# Patient Record
Sex: Male | Born: 1965 | Race: White | Hispanic: No | Marital: Married | Smoking: Never smoker
Health system: Southern US, Community
[De-identification: ages and names within clinical notes are randomized; demographics above are authoritative.]

## PROBLEM LIST (undated history)

## (undated) DIAGNOSIS — E7800 Pure hypercholesterolemia, unspecified: Secondary | ICD-10-CM

## (undated) DIAGNOSIS — R42 Dizziness and giddiness: Secondary | ICD-10-CM

## (undated) DIAGNOSIS — R079 Chest pain, unspecified: Secondary | ICD-10-CM

## (undated) DIAGNOSIS — Z6830 Body mass index (BMI) 30.0-30.9, adult: Secondary | ICD-10-CM

## (undated) HISTORY — DX: Chest pain, unspecified: R07.9

## (undated) HISTORY — DX: Pure hypercholesterolemia, unspecified: E78.00

## (undated) HISTORY — DX: Body mass index (BMI) 30.0-30.9, adult: Z68.30

## (undated) HISTORY — DX: Dizziness and giddiness: R42

---

## 1998-11-19 ENCOUNTER — Emergency Department (HOSPITAL_COMMUNITY): Admission: EM | Admit: 1998-11-19 | Discharge: 1998-11-19 | Payer: Self-pay | Admitting: Emergency Medicine

## 1999-05-08 ENCOUNTER — Emergency Department (HOSPITAL_COMMUNITY): Admission: EM | Admit: 1999-05-08 | Discharge: 1999-05-08 | Payer: Self-pay | Admitting: Emergency Medicine

## 2000-05-10 ENCOUNTER — Emergency Department (HOSPITAL_COMMUNITY): Admission: EM | Admit: 2000-05-10 | Discharge: 2000-05-10 | Payer: Self-pay | Admitting: Internal Medicine

## 2004-10-06 ENCOUNTER — Emergency Department (HOSPITAL_COMMUNITY): Admission: EM | Admit: 2004-10-06 | Discharge: 2004-10-06 | Payer: Self-pay | Admitting: Emergency Medicine

## 2016-08-23 ENCOUNTER — Ambulatory Visit (INDEPENDENT_AMBULATORY_CARE_PROVIDER_SITE_OTHER): Payer: Self-pay | Admitting: Podiatry

## 2016-08-23 ENCOUNTER — Telehealth: Payer: Self-pay | Admitting: Podiatry

## 2016-08-23 ENCOUNTER — Encounter: Payer: Self-pay | Admitting: Podiatry

## 2016-08-23 ENCOUNTER — Ambulatory Visit (INDEPENDENT_AMBULATORY_CARE_PROVIDER_SITE_OTHER): Payer: Self-pay

## 2016-08-23 VITALS — BP 157/95 | HR 83 | Resp 16 | Ht 69.0 in | Wt 188.0 lb

## 2016-08-23 DIAGNOSIS — M722 Plantar fascial fibromatosis: Secondary | ICD-10-CM

## 2016-08-23 DIAGNOSIS — M779 Enthesopathy, unspecified: Secondary | ICD-10-CM

## 2016-08-23 MED ORDER — DICLOFENAC SODIUM 75 MG PO TBEC: 75.0000 mg | DELAYED_RELEASE_TABLET | Freq: Two times a day (BID) | ORAL | 2 refills | Status: DC

## 2016-08-23 MED ORDER — TRIAMCINOLONE ACETONIDE 10 MG/ML IJ SUSP
10.0000 mg | Freq: Once | INTRAMUSCULAR | Status: AC
  Administered 2016-08-23: 10 mg

## 2016-08-23 NOTE — Patient Instructions (Signed)

## 2016-08-23 NOTE — Telephone Encounter (Signed)
I asked pt the kind of shoe he had to wear at work and he said he could wear any style. I instructed pt to try New Balance running, [employer]iacs or Sau[employer]y and get wide so toes can spread when stepping off. Pt asked if the Dr. Jari SportsmanScholls gel inserts were worthwhile and I said they compress down and don't give support and eventually don't give cushion. I told him a custom made orthotic would work best.

## 2016-08-23 NOTE — Telephone Encounter (Signed)
Pt forgot to ask you at his appointment today what type of work shoe should he wear. 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

## 2016-08-23 NOTE — Progress Notes (Signed)
   Subjective:    Patient ID: Jonathon Marshall, male    DOB: 19-Apr-1966, 51 y.o.   MRN: [id number]  HPI Chief Complaint  Patient presents with  . Foot Pain    Bilateral; back of heel; pt stated, "Has had pain since Sunday, August 18, 2016"      Review of Systems  Musculoskeletal: Positive for gait problem.  All other systems reviewed and are negative.      Objective:   Physical Exam        Assessment & Plan:

## 2016-08-24 NOTE — Progress Notes (Signed)
Subjective:     Patient ID: Jonathon Marshall, Jonathon Marshall   DOB: 22-Nov-1965, 51 y.o.   MRN: [id number]  HPI patient states she has developed pain in the back of both heels and that it is acute and he is not able to work due to discomfort. He did do some different activities and seen to flared up   Review of Systems  All other systems reviewed and are negative.      Objective:   Physical Exam  Constitutional: He is oriented to person, place, and time.  Cardiovascular: Intact distal pulses.   Musculoskeletal: Normal range of motion.  Neurological: He is oriented to person, place, and time.  Skin: Skin is warm.  Nursing note and vitals reviewed.  neurovascular status intact muscle strength adequate range of motion within normal limits with no equinus noted. Patient's found to have inflammatory posterior Achilles tendinitis lateral side bilateral with quite a bit of pain when palpated and the central and medial side seems to be pain free currently. It is very inflamed and he's tried ice and shoe gear modifications     Assessment:     Acute Achilles tendinitis bilateral    Plan:     H&P condition reviewed. I did discuss careful injection treatment of the lateral side explaining chances for rupture associated with this and patient wants procedure bilateral. Carefully injected the lateral side keeping it away from the central and medial with 3 mg Dexon some Kenalog 5 mill grams Xylocaine and advised on reduced activity and ice therapy. Placed on diclofenac and reappoint in several weeks to reevaluate  X-rays indicate posterior spur formation bilateral

## 2016-09-11 ENCOUNTER — Ambulatory Visit: Payer: Self-pay | Admitting: Podiatry

## 2016-10-23 ENCOUNTER — Telehealth: Payer: Self-pay | Admitting: *Deleted

## 2016-10-23 NOTE — Telephone Encounter (Signed)
Pt states he was seen 08/23/2016 concerning achilles tendonitis. I spoke with pt and he stated he received the injections 08/23/2016 and they helped for about 1 1/2 months but in the last 3-4 weeks he has begun to have the pain again although not as bad, what should he do. I told pt to make an appt, and to rest, ice 3-4 times daily for 15-20 minutes each session protecting skin with cloth from the ice pack and take his antiinflammatory. Pt states he did not get his antiinflammatory and I told him to get that it will help with the pain and inflammation. I told pt he could also perform non-weight bearing gentle stretches while seated, push heels out and point toes to the nose with knees straight and hold 30-60 seconds, discontinue if painful. Pt states he will get insurance sometime in May and will call for an appt, sooner if symptoms worsen.

## 2016-11-05 ENCOUNTER — Telehealth: Payer: Self-pay | Admitting: *Deleted

## 2016-11-05 NOTE — Telephone Encounter (Addendum)
Pt called stating he has an appt 11/20/2016, but has questions. Left message that I would call again.12/26/2016-Pt states he has questions before coming in. I spoke with pt, he states he is have excruciating pain in the back of his heels and he doesn't want to have surgery and he doesn't want to be on medications. I told pt he would benefit from discussing his concerns with Dr. Charlsie Merles. Pt states he has an appt tomorrow.

## 2016-11-20 ENCOUNTER — Ambulatory Visit: Payer: 59 | Admitting: Podiatry

## 2016-11-29 ENCOUNTER — Ambulatory Visit: Payer: 59 | Admitting: Podiatry

## 2016-12-27 ENCOUNTER — Ambulatory Visit: Payer: 59 | Admitting: Podiatry

## 2017-02-24 ENCOUNTER — Ambulatory Visit (INDEPENDENT_AMBULATORY_CARE_PROVIDER_SITE_OTHER): Payer: 59 | Admitting: Podiatry

## 2017-02-24 ENCOUNTER — Encounter: Payer: Self-pay | Admitting: Podiatry

## 2017-02-24 VITALS — BP 140/92 | HR 89 | Ht 69.0 in | Wt 180.0 lb

## 2017-02-24 DIAGNOSIS — M7661 Achilles tendinitis, right leg: Secondary | ICD-10-CM

## 2017-02-24 DIAGNOSIS — M7662 Achilles tendinitis, left leg: Secondary | ICD-10-CM

## 2017-02-24 MED ORDER — NABUMETONE 500 MG PO TABS: 500.0000 mg | ORAL_TABLET | Freq: Two times a day (BID) | ORAL | 1 refills | Status: DC

## 2017-02-24 NOTE — Progress Notes (Signed)
SUBJECTIVE: 51 y.o. year old male presents complaining of pain on both posterior heel duration of many years. Recently got flared up. Also he had to run to stop a machine at work. At that time the tendon got pulled and started to hurt bad.  Patient was referred by Dr. Zachary Georgeomaszewski.  HPI: Been treated with NSAIA, Cortisone injection and orthotics for heel pain.  REVIEW OF SYSTEMS: Pertinent items noted in HPI and remainder of comprehensive ROS otherwise negative.  OBJECTIVE: DERMATOLOGIC EXAMINATION: Normal findings.  VASCULAR EXAMINATION OF LOWER LIMBS: All pedal pulses are palpable with normal pulsation.  Capillary Filling times within 3 seconds in all digits.  Noted of no visible edema or erythema noted. Temperature gradient from tibial crest to dorsum of foot is within normal bilateral.  NEUROLOGIC EXAMINATION OF THE LOWER LIMBS: All epicritic and tactile sensations grossly intact.  MUSCULOSKELETAL EXAMINATION: Positive for pain at the posterior heel bilateral.  ASSESSMENT: Achilles tendonitis bilateral.  PLAN: Reviewed clinical findings and available treatment options. Night Splint dispensed with instruction. Relafen prescribed. Patient will return when ready to schedule surgery.

## 2017-02-24 NOTE — Patient Instructions (Addendum)
Seen for pain in both posterior heel bilateral. Reviewed findings and available options.  Night splint dispensed with instruction. Relafen prescribed. Return if needed surgery scheduled.

## 2017-02-26 ENCOUNTER — Ambulatory Visit: Payer: 59 | Admitting: Podiatry

## 2017-03-04 ENCOUNTER — Ambulatory Visit: Payer: 59 | Admitting: Podiatry

## 2018-05-26 ENCOUNTER — Ambulatory Visit: Payer: 59 | Admitting: Podiatry

## 2021-07-13 ENCOUNTER — Emergency Department (HOSPITAL_COMMUNITY): Payer: 59

## 2021-07-13 ENCOUNTER — Other Ambulatory Visit: Payer: Self-pay

## 2021-07-13 ENCOUNTER — Encounter (HOSPITAL_COMMUNITY): Payer: Self-pay

## 2021-07-13 ENCOUNTER — Emergency Department (HOSPITAL_COMMUNITY)
Admission: EM | Admit: 2021-07-13 | Discharge: 2021-07-14 | Disposition: A | Discharge status: Home / Self Care | Payer: 59 | Attending: Emergency Medicine | Admitting: Emergency Medicine

## 2021-07-13 DIAGNOSIS — R55 Syncope and collapse: Secondary | ICD-10-CM | POA: Insufficient documentation

## 2021-07-13 DIAGNOSIS — R072 Precordial pain: Secondary | ICD-10-CM | POA: Insufficient documentation

## 2021-07-13 DIAGNOSIS — R110 Nausea: Secondary | ICD-10-CM | POA: Insufficient documentation

## 2021-07-13 DIAGNOSIS — I10 Essential (primary) hypertension: Secondary | ICD-10-CM | POA: Diagnosis not present

## 2021-07-13 DIAGNOSIS — R42 Dizziness and giddiness: Secondary | ICD-10-CM | POA: Diagnosis present

## 2021-07-13 LAB — CBC
HCT: 45.4 % (ref 39.0–52.0)
Hemoglobin: 15.9 g/dL (ref 13.0–17.0)
MCH: 31.2 pg (ref 26.0–34.0)
MCHC: 35 g/dL (ref 30.0–36.0)
MCV: 89 fL (ref 80.0–100.0)
Platelets: 275 10*3/uL (ref 150–400)
RBC: 5.1 MIL/uL (ref 4.22–5.81)
RDW: 12.4 % (ref 11.5–15.5)
WBC: 7.8 10*3/uL (ref 4.0–10.5)
nRBC: 0 % (ref 0.0–0.2)

## 2021-07-13 LAB — BASIC METABOLIC PANEL
Anion gap: 7 (ref 5–15)
BUN: 12 mg/dL (ref 6–20)
CO2: 24 mmol/L (ref 22–32)
Calcium: 9.2 mg/dL (ref 8.9–10.3)
Chloride: 103 mmol/L (ref 98–111)
Creatinine, Ser: 1.23 mg/dL (ref 0.61–1.24)
GFR, Estimated: >60 mL/min (ref >60)
Glucose, Bld: 104 mg/dL — ABNORMAL HIGH (ref 70–99)
Potassium: 4.2 mmol/L (ref 3.5–5.1)
Sodium: 134 mmol/L — ABNORMAL LOW (ref 135–145)

## 2021-07-13 LAB — TROPONIN I (HIGH SENSITIVITY)
Troponin I (High Sensitivity): 8 ng/L (ref <18)
Troponin I (High Sensitivity): 7 ng/L (ref <18)

## 2021-07-13 NOTE — ED Provider Notes (Signed)
[facility] EMERGENCY DEPARTMENT Provider Note   CSN: [id number] Arrival date & time: 07/13/21  1416     History Chief Complaint  Patient presents with   Chest Pain   Dizziness    Jonathon Marshall is a 55 y.o. male with no significant past medical history who presents after an episode of chest pain.  The patient was had a work Engineer, drilling for [employer] when all of a sudden he had a crushing substernal chest pain.  He is unable to characterize the pain further other than saying "it just hurt really bad."  The pain did not radiate.  He felt lightheaded, nauseous, and then felt his head drop.  When he came to, everyone in the room was staring at him and he realized he had almost passed out.  His symptoms resolved spontaneously after about 5 to 10 minutes.  He describes a similar episode of chest pain about 2 weeks ago that also occurred at rest but was not associated with nausea, lightheadedness, or syncope.  He denies recent illnesses, fevers, chills, cough, congestion, shortness of breath, abdominal pain, urinary symptoms, or changes to bowel movements.  He is currently asymptomatic. HPI: A 55 year old patient with a history of hypertension presents for evaluation of chest pain. Initial onset of pain was more than 6 hours ago. The patient's chest pain is not worse with exertion. The patient's chest pain is middle- or left-sided, is not well-localized, is not described as heaviness/pressure/tightness, is not sharp and does not radiate to the arms/jaw/neck. The patient does not complain of nausea and denies diaphoresis. The patient has no history of stroke, has no history of peripheral artery disease, has not smoked in the past 90 days, denies any history of treated diabetes, has no relevant family history of coronary artery disease (first degree relative at less than age 17), has no history of hypercholesterolemia and does not have an elevated BMI (>=30).   History reviewed. No  pertinent past medical history.  There are no problems to display for this patient.   History reviewed. No pertinent surgical history.     History reviewed. No pertinent family history.  Social History   Tobacco Use   Smoking status: Never   Smokeless tobacco: Never    Home Medications Prior to Admission medications   Medication Sig Start Date End Date Taking? Authorizing Provider  diclofenac (VOLTAREN) 75 MG EC tablet Take 1 tablet (75 mg total) by mouth 2 (two) times daily. Patient not taking: Reported on 02/24/2017 08/23/16   Wallene Huh, DPM  nabumetone (RELAFEN) 500 MG tablet Take 1 tablet (500 mg total) by mouth 2 (two) times daily. Patient not taking: Reported on 07/13/2021 02/24/17   Camelia Phenes, DPM    Allergies    Radish [raphanus sativus] and Sulfa antibiotics  Review of Systems   Review of Systems  Constitutional:  Negative for chills and fever.  HENT:  Negative for ear pain and sore throat.   Eyes:  Negative for pain and visual disturbance.  Respiratory:  Negative for cough and shortness of breath.   Cardiovascular:  Positive for chest pain. Negative for palpitations.  Gastrointestinal:  Positive for nausea. Negative for abdominal pain and vomiting.  Genitourinary:  Negative for dysuria and hematuria.  Musculoskeletal:  Negative for arthralgias and back pain.  Skin:  Negative for color change and rash.  Neurological:  Positive for dizziness, syncope and light-headedness. Negative for seizures.  All other systems reviewed and are negative.  Physical  Exam Updated Vital Signs BP 132/90    Pulse 72    Temp 98.4 F (36.9 C) (Oral)    Resp 17    Ht 5\' 9"  (1.753 m)    Wt 81.6 kg    SpO2 96%    BMI 26.57 kg/m   Physical Exam Vitals and nursing note reviewed.  Constitutional:      General: He is not in acute distress.    Appearance: He is well-developed.  HENT:     Head: Normocephalic and atraumatic.     Right Ear: External ear normal.     Left Ear:  External ear normal.     Nose: Nose normal.     Mouth/Throat:     Mouth: Mucous membranes are moist.     Pharynx: Oropharynx is clear.  Eyes:     Extraocular Movements: Extraocular movements intact.     Conjunctiva/sclera: Conjunctivae normal.     Pupils: Pupils are equal, round, and reactive to light.  Cardiovascular:     Rate and Rhythm: Normal rate and regular rhythm.     Heart sounds: No murmur heard. Pulmonary:     Effort: Pulmonary effort is normal. No respiratory distress.     Breath sounds: Normal breath sounds. No wheezing, rhonchi or rales.  Chest:     Chest wall: No tenderness.  Abdominal:     General: There is no distension.     Palpations: Abdomen is soft.     Tenderness: There is no abdominal tenderness. There is no guarding or rebound.     Hernia: No hernia is present.  Musculoskeletal:        General: No swelling.     Cervical back: Neck supple.     Right lower leg: No edema.     Left lower leg: No edema.  Skin:    General: Skin is warm and dry.     Capillary Refill: Capillary refill takes less than 2 seconds.  Neurological:     General: No focal deficit present.     Mental Status: He is alert and oriented to person, place, and time.     GCS: GCS eye subscore is 4. GCS verbal subscore is 5. GCS motor subscore is 6.  Psychiatric:        Mood and Affect: Mood normal.    ED Results / Procedures / Treatments   Labs (all labs ordered are listed, but only abnormal results are displayed) Labs Reviewed  BASIC METABOLIC PANEL - Abnormal; Notable for the following components:      Result Value   Sodium 134 (*)    Glucose, Bld 104 (*)    All other components within normal limits  CBC  D-DIMER, QUANTITATIVE  TROPONIN I (HIGH SENSITIVITY)  TROPONIN I (HIGH SENSITIVITY)  TROPONIN I (HIGH SENSITIVITY)  TROPONIN I (HIGH SENSITIVITY)    EKG EKG Interpretation  Date/Time:  Friday July 13 2021 14:21:28 EST Ventricular Rate:  71 PR Interval:  172 QRS  Duration: 86 QT Interval:  374 QTC Calculation: 406 R Axis:   -7 Text Interpretation: Normal sinus rhythm Minimal voltage criteria for LVH, may be normal variant ( R in aVL ) Inferior infarct , age undetermined Abnormal ECG Confirmed by 11-14-2006 (Marianna Fuss) on 07/13/2021 10:34:31 PM  Radiology DG Chest 2 View  Result Date: 07/13/2021 CLINICAL DATA:  Chest pain EXAM: CHEST - 2 VIEW COMPARISON:  None. FINDINGS: The heart size and mediastinal contours are within normal limits. Both lungs are clear. The visualized skeletal  structures are unremarkable. IMPRESSION: No active cardiopulmonary disease. Electronically Signed   By: Keane Police D.O.   On: 07/13/2021 15:47    Procedures Procedures   Medications Ordered in ED Medications - No data to display  ED Course  I have reviewed the triage vital signs and the nursing notes.  Pertinent labs & imaging results that were available during my care of the patient were reviewed by me and considered in my medical decision making (see chart for details).    MDM Rules/Calculators/A&P HEAR Score: 3                        Patient presents with chest pain as described in HPI above.  Physical exam is unremarkable.  EKG shows normal sinus rhythm with T wave inversions in leads III and aVF.  There is also S1 Q3 T3 pattern.  There are no acute signs of ischemia or arrhythmia.  No recent illnesses or EKG changes to suggest peri-/myocarditis.  The patient has a hear score of 3.  CXR with no acute cardiopulmonary abnormality.  Specifically, no focal consolidation to suggest pneumonia, no pneumothorax or pneumomediastinum, and no widened mediastinum to suggest aortic dissection.  CBC is normal.  No leukocytosis to suggest infection.  No significant electrolyte abnormalities on BMP.  Patient had 2 negative troponins in first look.  Will obtain additional troponin and D-dimer given S1 Q3 T3 pattern on EKG associated with syncopal episode.  Patient's third  troponin and D-dimer are negative.  Will not obtain CTA to further pursue pulmonary embolism.  Given 3 negative troponins, I have low concern for ACS at this time.  Patient needs PCP follow-up and likely further cardiology evaluation for possible cath or stress testing.  He is appropriate for this work-up to be done in the outpatient setting.  Patient was discharged in stable condition.  Final Clinical Impression(s) / ED Diagnoses Final diagnoses:  Syncope, unspecified syncope type    Rx / DC Orders ED Discharge Orders     None        Pamalee Marcoe, Amalia Hailey, MD 07/14/21 1016    Fatima Blank, MD 07/15/21 [phone]

## 2021-07-13 NOTE — ED Triage Notes (Signed)
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 Pt reports getting heavy pressure in mid chest, feeling dizzy and weak and having numbness in L arm. Pt states he had similar episode 3 weeks ago. No PMH

## 2021-07-13 NOTE — ED Provider Notes (Signed)
Emergency Medicine Provider Triage Evaluation Note  Jonathon Marshall , a 55 y.o. male  was evaluated in triage.  Pt complains of chest pain episode for 10 minutes earlier today while at work [employer]. Pain was pressure-like, with numbness of left arm. Questionable syncopal episode where he felt his head nod, and came to surrounded by coworkers after being "out of it" for unknown time.  Patient endorses 1 prior episode of left-sided chest pain that lasted only for a minute or less around 3 weeks ago.  Patient denies previous history of ACS, diabetes, hypertension, hypercholesterolemia but does not go to the doctor regularly. Denies tobacco use.  Review of Systems  Positive: Chest pain, dizziness, questionable syncope Negative: Shortness of breath, nausea, vomiting  Physical Exam  BP (!) 169/113 (BP Location: Right Arm)    Pulse 71    Temp 98 F (36.7 C) (Oral)    Resp 17    SpO2 98%  Gen:   Awake, no distress   Resp:  Normal effort  MSK:   Moves extremities without difficulty  Other:  No TTP chest wall  Medical Decision Making  Medically screening exam initiated at 3:04 PM.  Appropriate orders placed.  Jonathon Marshall was informed that the remainder of the evaluation will be completed by another provider, this initial triage assessment does not replace that evaluation, and the importance of remaining in the ED until their evaluation is complete.  Chest pain, questionable syncope   Jonathon Marshall 07/13/21 1507    Jonathon Munch, MD 07/16/21 2009

## 2021-07-14 LAB — TROPONIN I (HIGH SENSITIVITY): Troponin I (High Sensitivity): 8 ng/L (ref <18)

## 2021-07-14 LAB — D-DIMER, QUANTITATIVE: D-Dimer, Quant: 0.46 ug/mL-FEU (ref 0.00–0.50)

## 2021-07-14 NOTE — ED Notes (Signed)
Discharge instructions reviewed with patient . Patient verbalized understanding of instructions. Follow-up care was reviewed. Patient ambulatory with steady gait. VSS upon discharge.  

## 2021-07-26 NOTE — Progress Notes (Signed)
Cardiology Office Note:    Date:  07/27/2021   ID:  Jonathon Marshall, DOB [birth year], MRN [id number]  PCP:  Noni Saupe, MD   [facility] Providers Cardiologist:  Alverda Skeans, MD Referring MD: Noni Saupe, MD   Chief Complaint/Reason for Referral:  Chest pain  ASSESSMENT:    Chest pain of uncertain etiology - Plan: CT CORONARY MORPH W/CTA COR W/SCORE W/CA W/CM &/OR WO/CM, metoprolol tartrate (LOPRESSOR) 50 MG tablet, ECHOCARDIOGRAM COMPLETE  Hypertension, unspecified type - Plan: CT CORONARY MORPH W/CTA COR W/SCORE W/CA W/CM &/OR WO/CM, metoprolol tartrate (LOPRESSOR) 50 MG tablet, ECHOCARDIOGRAM COMPLETE  Hyperlipidemia, unspecified hyperlipidemia type    PLAN:    In order of problems listed above:  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  His EKG demonstrates an inferior infarct pattern.  We will obtain a coronary CTA as his chest pain symptoms most recently have some atypical features.   I expect that we will see an occlusion of his distal left circumflex or right coronary artery.  We will have to see if he has has any residual high-grade or moderate stenoses and will manage that appropriately.  We will start aspirin 81 mg and atorvastatin 40 mg.  I will obtain an echocardiogram.  I will see him back in 3 months or earlier if needed. His blood pressure is well controlled on his current regimen. 3.   We will plan on checking a lipid panel and LFTs when I see him next time.            Dispo:  No follow-ups on file.     Medication Adjustments/Labs and Tests Ordered: Current medicines are reviewed at length with the patient today.  Concerns regarding medicines are outlined above.   Tests Ordered: Orders Placed This Encounter  Procedures   CT CORONARY MORPH W/CTA COR W/SCORE W/CA W/CM &/OR WO/CM   ECHOCARDIOGRAM COMPLETE    Medication Changes: Meds ordered this encounter  Medications   metoprolol  tartrate (LOPRESSOR) 50 MG tablet    Sig: Take 1 tablet (50 mg total) by mouth once for 1 dose. Take 90-120 minutes prior to scan.    Dispense:  1 tablet    Refill:  0   aspirin EC 81 MG tablet    Sig: Take 1 tablet (81 mg total) by mouth daily. Swallow whole.    Dispense:  90 tablet    Refill:  3   atorvastatin (LIPITOR) 40 MG tablet    Sig: Take 1 tablet (40 mg total) by mouth daily.    Dispense:  90 tablet    Refill:  3    History of Present Illness:     The patient is a 56 y.o. male with the indicated medical history here for chest pain.  The patient was seen in the emergency department.  The patient had been lunch function around Christmas time when he had the sudden onset of crushing substernal chest pain.  He felt lightheaded and nauseous.  Apparently he almost passed out.  His symptoms resolved over a few minutes.  He had an episode of chest pain 2 weeks prior to this as well.  His EKG demonstrates sinus rhythm and old inferior infarct pattern.  His biochemical markers and other laboratories were negative.  He was discharged home.  The patient denies any recurrent chest pain since these episodes.  He denies any exertional dyspnea, presyncope, or syncope.  Interestingly he tells me about  3 years ago he was using a chainsaw and he developed very severe and sudden chest pain as well as nausea and diaphoresis.  He did not seek medical attention.  He had been drinking quite a bit.  He decided to quit alcohol altogether about 8 to 10 months ago.  He finds that he has been substituting alcohol with soda.  He does not smoke.  He has required no emergency room visits or hospitalizations recently.     Previous Medical History: Past Medical History:  Diagnosis Date   BMI 30.0-30.9,adult    Chest pain    Dizziness    Hypercholesteremia      Current Medications: Current Meds  Medication Sig   aspirin EC 81 MG tablet Take 1 tablet (81 mg total) by mouth daily. Swallow whole.    atorvastatin (LIPITOR) 40 MG tablet Take 1 tablet (40 mg total) by mouth daily.   metoprolol tartrate (LOPRESSOR) 50 MG tablet Take 1 tablet (50 mg total) by mouth once for 1 dose. Take 90-120 minutes prior to scan.     Allergies:    Radish [raphanus sativus] and Sulfa antibiotics   Social History:   Social History   Tobacco Use   Smoking status: Never   Smokeless tobacco: Never     Family Hx: Family History  Problem Relation Age of Onset   Cerebral aneurysm Mother    Heart disease Mother    Hypertension Father      Review of Systems:   Please see the history of present illness.    All other systems reviewed and are negative.  EKGs/Labs/Other Test Reviewed:    EKG:  prior EKG: SR with old inferior infarct pattern  Prior CV studies: None available  Imaging studies that I have independently reviewed today: None available  Recent Labs: 07/13/2021: BUN 12; Creatinine, Ser 1.23; Hemoglobin 15.9; Platelets 275; Potassium 4.2; Sodium 134   Recent Lipid Panel No results found for: CHOL, TRIG, HDL, LDLCALC, LDLDIRECT  Risk Assessment/Calculations:          Physical Exam:    VS:  BP 108/78    Pulse 66    Ht 5\' 9"  (1.753 m)    Wt 220 lb (99.8 kg)    SpO2 98%    BMI 32.49 kg/m    Wt Readings from Last 3 Encounters:  07/27/21 220 lb (99.8 kg)  07/13/21 179 lb 14.3 oz (81.6 kg)  02/24/17 180 lb (81.6 kg)    GENERAL:  No apparent distress, AOx3 HEENT:  No carotid bruits, +2 carotid impulses, no scleral icterus CAR: RRR no murmurs, gallops, rubs, or thrills RES:  Clear to auscultation bilaterally ABD:  Soft, nontender, nondistended, positive bowel sounds x 4 VASC:  +2 radial pulses, +2 carotid pulses, palpable pedal pulses NEURO:  CN 2-12 grossly intact; motor and sensory grossly intact PSYCH:  No active depression or anxiety EXT:  No edema, ecchymosis, or cyanosis  Signed, 04/26/17, MD  07/27/2021 5:00 PM    [facility] [address]  [place], [place], [place]  [place] Phone: [phone]; Fax: [phone]   Note:  This document was prepared using Dragon voice recognition software and may include unintentional dictation errors.

## 2021-07-27 ENCOUNTER — Ambulatory Visit (INDEPENDENT_AMBULATORY_CARE_PROVIDER_SITE_OTHER): Payer: 59 | Admitting: Internal Medicine

## 2021-07-27 ENCOUNTER — Other Ambulatory Visit: Payer: Self-pay

## 2021-07-27 ENCOUNTER — Encounter: Payer: Self-pay | Admitting: Internal Medicine

## 2021-07-27 VITALS — BP 108/78 | HR 66 | Ht 69.0 in | Wt 220.0 lb

## 2021-07-27 DIAGNOSIS — I10 Essential (primary) hypertension: Secondary | ICD-10-CM | POA: Diagnosis not present

## 2021-07-27 DIAGNOSIS — R079 Chest pain, unspecified: Secondary | ICD-10-CM | POA: Diagnosis not present

## 2021-07-27 DIAGNOSIS — E785 Hyperlipidemia, unspecified: Secondary | ICD-10-CM

## 2021-07-27 MED ORDER — ATORVASTATIN CALCIUM 40 MG PO TABS: 40.0000 mg | ORAL_TABLET | Freq: Every day | ORAL | 3 refills | Status: DC

## 2021-07-27 MED ORDER — METOPROLOL TARTRATE 50 MG PO TABS: 50.0000 mg | ORAL_TABLET | Freq: Once | ORAL | 0 refills | Status: DC

## 2021-07-27 MED ORDER — ASPIRIN EC 81 MG PO TBEC: 81.0000 mg | DELAYED_RELEASE_TABLET | Freq: Every day | ORAL | 3 refills | Status: DC

## 2021-07-27 NOTE — Patient Instructions (Signed)
Medication Instructions:  Your physician has recommended you make the following change in your medication:   1.) start aspirin 81 mg - take one tablet by mouth daily  2.) start atorvastatin (Lipitor) 40 mg - take one tablet by mouth daily  *If you need a refill on your cardiac medications before your next appointment, please call your pharmacy*   Lab Work: None ordered today   Testing/Procedures: Your physician has requested that you have an echocardiogram. Echocardiography is a painless test that uses sound waves to create images of your heart. It provides your doctor with information about the size and shape of your heart and how well your hearts chambers and valves are working. This procedure takes approximately one hour. There are no restrictions for this procedure.  Cardiac CTA - see instructions below.   Follow-Up: At [facility], you and your health needs are our priority.  As part of our continuing mission to provide you with exceptional heart care, we have created designated Provider Care Teams.  These Care Teams include your primary Cardiologist (physician) and Advanced Practice Providers (APPs -  Physician Assistants and Nurse Practitioners) who all work together to provide you with the care you need, when you need it.  We recommend signing up for the patient portal called "MyChart".  Sign up information is provided on this After Visit Summary.  MyChart is used to connect with patients for Virtual Visits (Telemedicine).  Patients are able to view lab/test results, encounter notes, upcoming appointments, etc.  Non-urgent messages can be sent to your provider as well.   To learn more about what you can do with MyChart, go to [email or web address].    Your next appointment:   3 month(s)  The format for your next appointment:   In Person  Provider:   Early Osmond, MD     Other Instructions   Your cardiac CT will be scheduled at  [facility], [place] [address] [phone]  Please arrive at the [facility] main entrance (entrance A) of [facility] 30 minutes prior to test start time. You can use the FREE valet parking offered at the main entrance (encouraged to control the heart rate for the test) Proceed to the [facility] Radiology Department (first floor) to check-in and test prep.   Please follow these instructions carefully (unless otherwise directed):  Hold all erectile dysfunction medications at least 3 days (72 hrs) prior to test.  On the Night Before the Test: Be sure to Drink plenty of water. Do not consume any caffeinated/decaffeinated beverages or chocolate 12 hours prior to your test. Do not take any antihistamines 12 hours prior to your test.   On the Day of the Test: Drink plenty of water until 1 hour prior to the test. Do not eat any food 4 hours prior to the test. You may take your regular medications prior to the test.  Take metoprolol (Lopressor) two hours prior to test.      After the Test: Drink plenty of water. After receiving IV contrast, you may experience a mild flushed feeling. This is normal. On occasion, you may experience a mild rash up to 24 hours after the test. This is not dangerous. If this occurs, you can take Benadryl 25 mg and increase your fluid intake. If you experience trouble breathing, this can be serious. If it is severe call 911 IMMEDIATELY. If it is mild, please call our office. If you take any of  these medications: Glipizide/Metformin, Avandament, Glucavance, please do not take 48 hours after completing test unless otherwise instructed.  Please allow 2-4 weeks for scheduling of routine cardiac CTs. Some insurance companies require a pre-authorization which may delay scheduling of this test.   For non-scheduling related questions, please contact the cardiac imaging nurse navigator should you have any questions/concerns: Marchia Bond, Cardiac Imaging  Nurse Navigator Gordy Clement, Cardiac Imaging Nurse Navigator  Heart and Vascular Services Direct Office Dial: [phone]   For scheduling needs, including cancellations and rescheduling, please call [place], [phone].

## 2021-08-08 ENCOUNTER — Ambulatory Visit (HOSPITAL_COMMUNITY): Payer: 59 | Attending: Internal Medicine

## 2021-08-08 ENCOUNTER — Other Ambulatory Visit: Payer: Self-pay

## 2021-08-08 DIAGNOSIS — I10 Essential (primary) hypertension: Secondary | ICD-10-CM | POA: Diagnosis not present

## 2021-08-08 DIAGNOSIS — R079 Chest pain, unspecified: Secondary | ICD-10-CM | POA: Diagnosis present

## 2021-08-08 LAB — ECHOCARDIOGRAM COMPLETE
Area-P 1/2: 3 cm2
S' Lateral: 3.1 cm

## 2021-08-13 ENCOUNTER — Telehealth (HOSPITAL_COMMUNITY): Payer: Self-pay | Admitting: Emergency Medicine

## 2021-08-13 NOTE — Telephone Encounter (Signed)
Vm box not setup to receive vm  Rockwell Alexandria RN Navigator Cardiac Imaging [facility] Heart and Vascular Services [phone] Office  [phone] Cell

## 2021-08-14 ENCOUNTER — Other Ambulatory Visit: Payer: Self-pay

## 2021-08-14 ENCOUNTER — Other Ambulatory Visit: Payer: Self-pay | Admitting: Internal Medicine

## 2021-08-14 ENCOUNTER — Ambulatory Visit (HOSPITAL_COMMUNITY)
Admission: RE | Admit: 2021-08-14 | Discharge: 2021-08-14 | Disposition: A | Discharge status: Home / Self Care | Payer: 59 | Source: Ambulatory Visit | Attending: Internal Medicine | Admitting: Internal Medicine

## 2021-08-14 ENCOUNTER — Encounter (HOSPITAL_COMMUNITY): Payer: Self-pay

## 2021-08-14 DIAGNOSIS — R079 Chest pain, unspecified: Secondary | ICD-10-CM | POA: Diagnosis present

## 2021-08-14 DIAGNOSIS — I10 Essential (primary) hypertension: Secondary | ICD-10-CM | POA: Diagnosis present

## 2021-08-14 DIAGNOSIS — I2510 Atherosclerotic heart disease of native coronary artery without angina pectoris: Secondary | ICD-10-CM

## 2021-08-14 MED ORDER — IOHEXOL 350 MG/ML SOLN
95.0000 mL | Freq: Once | INTRAVENOUS | Status: AC | PRN
  Administered 2021-08-14: 17:00:00 95 mL via INTRAVENOUS

## 2021-08-14 MED ORDER — NITROGLYCERIN 0.4 MG SL SUBL
SUBLINGUAL_TABLET | SUBLINGUAL | Status: AC
  Filled 2021-08-14: qty 2

## 2021-08-14 MED ORDER — NITROGLYCERIN 0.4 MG SL SUBL
0.8000 mg | SUBLINGUAL_TABLET | Freq: Once | SUBLINGUAL | Status: AC
  Administered 2021-08-14: 16:00:00 0.8 mg via SUBLINGUAL

## 2021-08-22 ENCOUNTER — Ambulatory Visit
Admission: EM | Admit: 2021-08-22 | Discharge: 2021-08-22 | Disposition: A | Discharge status: Home / Self Care | Payer: 59 | Attending: Internal Medicine | Admitting: Internal Medicine

## 2021-08-22 ENCOUNTER — Other Ambulatory Visit: Payer: Self-pay

## 2021-08-22 DIAGNOSIS — S39012A Strain of muscle, fascia and tendon of lower back, initial encounter: Secondary | ICD-10-CM | POA: Diagnosis not present

## 2021-08-22 MED ORDER — CYCLOBENZAPRINE HCL 5 MG PO TABS: 5.0000 mg | ORAL_TABLET | Freq: Two times a day (BID) | ORAL | 0 refills | Status: DC | PRN

## 2021-08-22 MED ORDER — KETOROLAC TROMETHAMINE 30 MG/ML IJ SOLN
30.0000 mg | Freq: Once | INTRAMUSCULAR | Status: AC
  Administered 2021-08-22: 30 mg via INTRAMUSCULAR

## 2021-08-22 NOTE — ED Provider Notes (Signed)
[facility]    CSN: [id number] Arrival date & time: 08/22/21  1807      History   Chief Complaint Chief Complaint  Patient presents with   Back Pain    lumbar    HPI Jonathon Marshall is a 56 y.o. male.   Patient presents with 3 to 4-day history of left-sided lower back pain.  Denies any obvious injury.  Denies history of chronic injury or back pain.  Patient reports that pain started randomly.  Movement exacerbates pain and is worse when lying flat.  Pain does not radiate.  Patient has taken Advil and aspirin with no improvement in symptoms.  Denies numbness or tingling.  Denies urinary burning, urinary frequency, hematuria, urinary or bowel continence, saddle anesthesia.   Back Pain  Past Medical History:  Diagnosis Date   BMI 30.0-30.9,adult    Chest pain    Dizziness    Hypercholesteremia     There are no problems to display for this patient.   History reviewed. No pertinent surgical history.     Home Medications    Prior to Admission medications   Medication Sig Start Date End Date Taking? Authorizing Provider  cyclobenzaprine (FLEXERIL) 5 MG tablet Take 1 tablet (5 mg total) by mouth 2 (two) times daily as needed for muscle spasms. 08/22/21  Yes Teodora Medici, FNP  aspirin EC 81 MG tablet Take 1 tablet (81 mg total) by mouth daily. Swallow whole. 07/27/21   Early Osmond, MD  atorvastatin (LIPITOR) 40 MG tablet Take 1 tablet (40 mg total) by mouth daily. 07/27/21   Early Osmond, MD  diclofenac (VOLTAREN) 75 MG EC tablet Take 1 tablet (75 mg total) by mouth 2 (two) times daily. Patient not taking: Reported on 02/24/2017 08/23/16   Wallene Huh, DPM  metoprolol tartrate (LOPRESSOR) 50 MG tablet Take 1 tablet (50 mg total) by mouth once for 1 dose. Take 90-120 minutes prior to scan. 07/27/21 07/27/21  Early Osmond, MD  nabumetone (RELAFEN) 500 MG tablet Take 1 tablet (500 mg total) by mouth 2 (two) times daily. Patient not taking: Reported on  07/13/2021 02/24/17   Camelia Phenes, DPM    Family History Family History  Problem Relation Age of Onset   Cerebral aneurysm Mother    Heart disease Mother    Hypertension Father     Social History Social History   Tobacco Use   Smoking status: Never   Smokeless tobacco: Never     Allergies   Radish [raphanus sativus] and Sulfa antibiotics   Review of Systems Review of Systems Per HPI  Physical Exam Triage Vital Signs ED Triage Vitals  Enc Vitals Group     BP 08/22/21 1816 (!) 154/104     Pulse Rate 08/22/21 1816 76     Resp 08/22/21 1816 18     Temp 08/22/21 1816 (!) 97.4 F (36.3 C)     Temp Source 08/22/21 1816 Oral     SpO2 08/22/21 1816 95 %     Weight --      Height --      Head Circumference --      Peak Flow --      Pain Score 08/22/21 1819 8     Pain Loc --      Pain Edu? --      Excl. in [place]? --    No data found.  Updated Vital Signs BP (!) 154/104 (BP Location: Left Arm) Comment:  BP meds not taken yet   Pulse 76    Temp (!) 97.4 F (36.3 C) (Oral)    Resp 18    SpO2 95%   Visual Acuity Right Eye Distance:   Left Eye Distance:   Bilateral Distance:    Right Eye Near:   Left Eye Near:    Bilateral Near:     Physical Exam Constitutional:      General: He is not in acute distress.    Appearance: Normal appearance. He is not toxic-appearing or diaphoretic.  HENT:     Head: Normocephalic and atraumatic.  Eyes:     Extraocular Movements: Extraocular movements intact.     Conjunctiva/sclera: Conjunctivae normal.  Pulmonary:     Effort: Pulmonary effort is normal.  Musculoskeletal:     Cervical back: Normal.     Thoracic back: Normal.     Lumbar back: Tenderness present. No swelling, edema or bony tenderness. Negative right straight leg raise test and negative left straight leg raise test.       Back:     Comments: Tenderness to palpation to left lower lumbar region.  No direct spinal tenderness, crepitus, step-off.  Neurological:      General: No focal deficit present.     Mental Status: He is alert and oriented to person, place, and time. Mental status is at baseline.     Deep Tendon Reflexes: Reflexes are normal and symmetric.  Psychiatric:        Mood and Affect: Mood normal.        Behavior: Behavior normal.        Thought Content: Thought content normal.        Judgment: Judgment normal.     UC Treatments / Results  Labs (all labs ordered are listed, but only abnormal results are displayed) Labs Reviewed - No data to display  EKG   Radiology No results found.  Procedures Procedures (including critical care time)  Medications Ordered in UC Medications  ketorolac (TORADOL) 30 MG/ML injection 30 mg (30 mg Intramuscular Given 08/22/21 1834)    Initial Impression / Assessment and Plan / UC Course  I have reviewed the triage vital signs and the nursing notes.  Pertinent labs & imaging results that were available during my care of the patient were reviewed by me and considered in my medical decision making (see chart for details).     Physical exam is consistent with lower lumbar strain.  Will prescribe cyclobenzaprine muscle relaxer.  Advised patient this can cause drowsiness.  Offered patient prednisone steroid as pain has been refractory to NSAIDs but patient declined.  Ketorolac injection administered in urgent care today.  Advised patient to avoid any NSAIDs for at least 24 hours following this injection.  Discussed return precautions.  Do not think that imaging is needed given that there is no direct spinal tenderness or injury.  Patient verbalized understanding and was agreeable with plan. Final Clinical Impressions(s) / UC Diagnoses   Final diagnoses:  Strain of lumbar region, initial encounter     Discharge Instructions      It appears that you have a lower back strain.you have been prescribed a muscle relaxer to help alleviate this.  Please be advised that this can cause drowsiness.   Follow-up with primary care or urgent care if symptoms persist.    ED Prescriptions     Medication Sig Dispense Auth. Provider   cyclobenzaprine (FLEXERIL) 5 MG tablet Take 1 tablet (5 mg total) by mouth  2 (two) times daily as needed for muscle spasms. 20 tablet [place], [place] E, [place]      I have reviewed the PDMP during this encounter.   Teodora Medici, [place] 08/22/21 [phone]

## 2021-08-22 NOTE — ED Triage Notes (Signed)
3-4 day h/o left sided lumbar pain that has increased since the onset. Pain is interfering with his sleep noting he tried his bed and cough w/o relief. Pt denies any lifting, twisting or bending at the onset. Has been taking advil and aspirin w/o relief. Standing aggravates sxs.Pain radiates into his left buttock. No pain or n/t in BLE. No falls or injuries.

## 2021-08-22 NOTE — Discharge Instructions (Signed)
It appears that you have a lower back strain.you have been prescribed a muscle relaxer to help alleviate this.  Please be advised that this can cause drowsiness.  Follow-up with primary care or urgent care if symptoms persist.

## 2021-08-27 ENCOUNTER — Encounter (HOSPITAL_BASED_OUTPATIENT_CLINIC_OR_DEPARTMENT_OTHER): Payer: Self-pay

## 2021-08-27 ENCOUNTER — Other Ambulatory Visit (HOSPITAL_BASED_OUTPATIENT_CLINIC_OR_DEPARTMENT_OTHER): Payer: Self-pay | Admitting: Family Medicine

## 2021-08-27 ENCOUNTER — Other Ambulatory Visit: Payer: Self-pay

## 2021-08-27 ENCOUNTER — Ambulatory Visit (HOSPITAL_BASED_OUTPATIENT_CLINIC_OR_DEPARTMENT_OTHER)
Admission: RE | Admit: 2021-08-27 | Discharge: 2021-08-27 | Disposition: A | Discharge status: Home / Self Care | Payer: 59 | Source: Ambulatory Visit | Attending: Family Medicine | Admitting: Family Medicine

## 2021-08-27 DIAGNOSIS — N419 Inflammatory disease of prostate, unspecified: Secondary | ICD-10-CM | POA: Diagnosis present

## 2021-08-29 ENCOUNTER — Other Ambulatory Visit: Payer: Self-pay

## 2021-08-29 MED ORDER — ATORVASTATIN CALCIUM 40 MG PO TABS: 40.0000 mg | ORAL_TABLET | Freq: Every day | ORAL | 3 refills | Status: DC

## 2021-08-29 NOTE — Telephone Encounter (Signed)
Pt's medication was sent to pt's pharmacy as requested. Confirmation received.  °

## 2021-11-12 ENCOUNTER — Ambulatory Visit (INDEPENDENT_AMBULATORY_CARE_PROVIDER_SITE_OTHER): Payer: 59 | Admitting: Internal Medicine

## 2021-11-12 DIAGNOSIS — I10 Essential (primary) hypertension: Secondary | ICD-10-CM

## 2021-11-12 DIAGNOSIS — I2510 Atherosclerotic heart disease of native coronary artery without angina pectoris: Secondary | ICD-10-CM

## 2021-11-12 DIAGNOSIS — E785 Hyperlipidemia, unspecified: Secondary | ICD-10-CM

## 2021-11-12 NOTE — Progress Notes (Signed)
?  Cardiology Office Note:   ? ?Date:  11/12/2021  ? ?ID:  Jonathon Marshall, DOB 1966/02/17, MRN [id number] ? ?PCP:  Angelina Sheriff, MD  ? ?[facility] Prov[facility] ?Cardiologist:  Lenna Sciara, MD ?Referring MD: Angelina Sheriff, MD  ? ?Chief Complaint/Reason for Referral: Cardiology follow-up ? ?Patient did not appear for appointment. ? ?ASSESSMENT:   ? ?1. Coronary artery disease involving native coronary artery of native heart without angina pectoris   ?2. Hyperlipidemia LDL goal <70   ?3. Hypertension, unspecified type   ? ? ?PLAN:   ? ?In order of problems listed above: ?1.  Mild coronary artery disease:  ?2.  Hyperlipidemia: . ?3.  Hypertension: ? ? ?    ? ? ?History of Present Illness:   ? ?FOCUSED PROBLEM LIST:   ?1.  Mild obstructive coronary artery disease approximately the on coronary CTA January 2023 ?2.  Hyperlipidemia ?3.  Hypertension ?4.  BMI of 30 ? ?The patient is a 56 y.o. male with the indicated medical history here for cardiology follow-up. ? ?January 2023: Patient was seen in initial consultation.  Due to chest pain symptoms he was referred for coronary CTA which demonstrated mild nonobstructive disease.  An echocardiogram demonstrated normal ejection fraction with no significant valvular abnormalities.  He was initiated on aspirin 81 mg a day and atorvastatin 40 mg daily. ? ?Today: ?  ? ? ?EKGs/Labs/Other Test Reviewed:   ? ?EKG:  EKG performed December 2022 that I personally reviewed demonstrates sinus rhythm with inferior infarction pattern. ? ?Prior CV studies: ? ?Coronary CTA 2023: Coronary calcium score 15.2 with mild mixed plaque of proximal LAD ? ?Echocardiogram 2023: Ejection fraction of 60 to 65% with no significant valvular abnormalities ? ?Other studies Reviewed: ?Review of the additional studies/records demonstrates: CT abdomen pelvis February 2023 without aortic atherosclerosis ? ? ?Signed, ?Early Osmond, MD  ?11/12/2021 7:03 AM    ?[facility] [address], [place], [place]  [address] ?Phone: [phone]; Fax: [phone]  ? ?Note:  This document was prepared using Dragon voice recognition software and may include unintentional dictation errors. ?

## 2021-11-23 ENCOUNTER — Encounter: Payer: Self-pay | Admitting: Internal Medicine

## 2021-12-23 ENCOUNTER — Ambulatory Visit
Admission: EM | Admit: 2021-12-23 | Discharge: 2021-12-23 | Disposition: A | Discharge status: Home / Self Care | Payer: BC Managed Care – PPO

## 2021-12-23 ENCOUNTER — Telehealth: Payer: Self-pay

## 2021-12-23 DIAGNOSIS — H109 Unspecified conjunctivitis: Secondary | ICD-10-CM

## 2021-12-23 MED ORDER — POLYMYXIN B-TRIMETHOPRIM 10000-0.1 UNIT/ML-% OP SOLN: 1.0000 [drp] | OPHTHALMIC | 0 refills | Status: DC

## 2021-12-23 MED ORDER — POLYMYXIN B-TRIMETHOPRIM 10000-0.1 UNIT/ML-% OP SOLN: 1.0000 [drp] | OPHTHALMIC | 0 refills | Status: AC

## 2021-12-23 NOTE — ED Provider Notes (Signed)
[facility]    CSN: [id number] Arrival date & time: 12/23/21  1505      History   Chief Complaint No chief complaint on file.   HPI Jonathon Marshall is a 56 y.o. male.   Patient presents with right eye pain and discharge that has been present for approximately 4 days.  Patient reports that he has been exposed to pinkeye at work.  Denies any associated upper respiratory symptoms or fever.  Denies blurry vision.  Patient does not wear contacts.  Denies trauma or foreign body to the eye.    Past Medical History:  Diagnosis Date   BMI 30.0-30.9,adult    Chest pain    Dizziness    Hypercholesteremia     There are no problems to display for this patient.   No past surgical history on file.     Home Medications    Prior to Admission medications   Medication Sig Start Date End Date Taking? Authorizing Provider  tamsulosin (FLOMAX) 0.4 MG CAPS capsule Take 0.4 mg by mouth.   Yes [provider]  trimethoprim-polymyxin b (POLYTRIM) ophthalmic solution Place 1 drop into the right eye every 4 (four) hours for 7 days. 12/23/21 12/30/21 Yes Gustavus Bryant, FNP  aspirin EC 81 MG tablet Take 1 tablet (81 mg total) by mouth daily. Swallow whole. 07/27/21   Orbie Pyo, MD  atorvastatin (LIPITOR) 40 MG tablet Take 1 tablet (40 mg total) by mouth daily. Patient not taking: Reported on 12/23/2021 08/29/21   Orbie Pyo, MD  cyclobenzaprine (FLEXERIL) 5 MG tablet Take 1 tablet (5 mg total) by mouth 2 (two) times daily as needed for muscle spasms. Patient not taking: Reported on 12/23/2021 08/22/21   Gustavus Bryant, FNP  diclofenac (VOLTAREN) 75 MG EC tablet Take 1 tablet (75 mg total) by mouth 2 (two) times daily. Patient not taking: Reported on 02/24/2017 08/23/16   Lenn Sink, DPM  metoprolol tartrate (LOPRESSOR) 50 MG tablet Take 1 tablet (50 mg total) by mouth once for 1 dose. Take 90-120 minutes prior to scan. Patient not taking: Reported on 12/23/2021 07/27/21  07/27/21  Orbie Pyo, MD  nabumetone (RELAFEN) 500 MG tablet Take 1 tablet (500 mg total) by mouth 2 (two) times daily. Patient not taking: Reported on 07/13/2021 02/24/17   Charlett Nose, DPM    Family History Family History  Problem Relation Age of Onset   Cerebral aneurysm Mother    Heart disease Mother    Hypertension Father     Social History Social History   Tobacco Use   Smoking status: Never   Smokeless tobacco: Never     Allergies   Radish [raphanus sativus] and Sulfa antibiotics   Review of Systems Review of Systems Per HPI  Physical Exam Triage Vital Signs ED Triage Vitals  Enc Vitals Group     BP 12/23/21 1522 (!) 150/92     Pulse Rate 12/23/21 1522 90     Resp 12/23/21 1522 20     Temp 12/23/21 1522 97.7 F (36.5 C)     Temp src --      SpO2 12/23/21 1522 97 %     Weight --      Height --      Head Circumference --      Peak Flow --      Pain Score 12/23/21 1521 6     Pain Loc --      Pain Edu? --  Excl. in [place]? --    No data found.  Updated Vital Signs BP (!) 150/92   Pulse 90   Temp 97.7 F (36.5 C)   Resp 20   SpO2 97%   Visual Acuity Right Eye Distance: 20/20 Left Eye Distance: 20/30 Bilateral Distance: 20/20  Right Eye Near:   Left Eye Near:    Bilateral Near:     Physical Exam Constitutional:      General: He is not in acute distress.    Appearance: Normal appearance. He is not toxic-appearing or diaphoretic.  HENT:     Head: Normocephalic and atraumatic.  Eyes:     General: Lids are normal. Lids are everted, no foreign bodies appreciated. Vision grossly intact. Gaze aligned appropriately.     Extraocular Movements: Extraocular movements intact.     Conjunctiva/sclera:     Right eye: Right conjunctiva is injected. No chemosis or hemorrhage.    Left eye: Left conjunctiva is not injected. No chemosis, exudate or hemorrhage.    Pupils: Pupils are equal, round, and reactive to light.  Pulmonary:     Effort:  Pulmonary effort is normal.  Neurological:     General: No focal deficit present.     Mental Status: He is alert and oriented to person, place, and time. Mental status is at baseline.  Psychiatric:        Mood and Affect: Mood normal.        Behavior: Behavior normal.        Thought Content: Thought content normal.        Judgment: Judgment normal.     UC Treatments / Results  Labs (all labs ordered are listed, but only abnormal results are displayed) Labs Reviewed - No data to display  EKG   Radiology No results found.  Procedures Procedures (including critical care time)  Medications Ordered in UC Medications - No data to display  Initial Impression / Assessment and Plan / UC Course  I have reviewed the triage vital signs and the nursing notes.  Pertinent labs & imaging results that were available during my care of the patient were reviewed by me and considered in my medical decision making (see chart for details).     Physical exam is consistent with bacterial conjunctivitis especially given patient's close exposure.  Will treat with Polytrim antibiotic drops.  Visual acuity appears normal.  Patient to follow-up with eye doctor if symptoms persist or worsen.  Patient verbalized understanding and was agreeable with plan. Final Clinical Impressions(s) / UC Diagnoses   Final diagnoses:  Bacterial conjunctivitis of right eye     Discharge Instructions      It appears that you have pinkeye which is being treated with antibiotic drop.  Follow-up with eye doctor if symptoms persist or worsen.    ED Prescriptions     Medication Sig Dispense Auth. Provider   trimethoprim-polymyxin b (POLYTRIM) ophthalmic solution Place 1 drop into the right eye every 4 (four) hours for 7 days. 10 mL Gustavus Bryant, [place]      PDMP not reviewed this encounter.   Gustavus Bryant, [place] 12/23/21 1544

## 2021-12-23 NOTE — ED Triage Notes (Signed)
Patient presents to Urgent Care with complaints of right eye pain and discharge since Thursday.

## 2021-12-23 NOTE — Discharge Instructions (Signed)
It appears that you have pinkeye which is being treated with antibiotic drop.  Follow-up with eye doctor if symptoms persist or worsen.

## 2023-04-09 ENCOUNTER — Encounter: Payer: Self-pay | Admitting: Podiatry

## 2023-04-09 ENCOUNTER — Ambulatory Visit: Payer: 59 | Admitting: Podiatry

## 2023-06-05 IMAGING — CT CT HEART MORP W/ CTA COR W/ SCORE W/ CA W/CM &/OR W/O CM
4 of 7 series · 8 of 20 positions shown, 9 images · non-contrast
Comparison: Chest x-ray of July 13, 2021.

Addendum:
CLINICAL DATA: 55 yo male with chest pain

EXAM:
Cardiac/Coronary CTA
TECHNIQUE: A non-contrast, gated CT scan was obtained with axial slices of 3 mm
through the heart for calcium scoring. Calcium scoring was performed
using the Agatston method. A 120 kV prospective, gated, contrast
cardiac scan was obtained. Gantry rotation speed was 250 msecs and
collimation was 0.6 mm. Two sublingual nitroglycerin tablets (0.8
mg) were given. The 3D data set was reconstructed in 5% intervals of
the 35-75% of the R-R cycle. Diastolic phases were analyzed on a
dedicated workstation using MPR, MIP, and VRT modes. The patient
received 95 cc of contrast.

[Series 6: best syst · axial · 0.39mm/px · z∈[-283,-248]mm · 2 of 261 slices shown, 3 images]
[im 87/261  vessel]
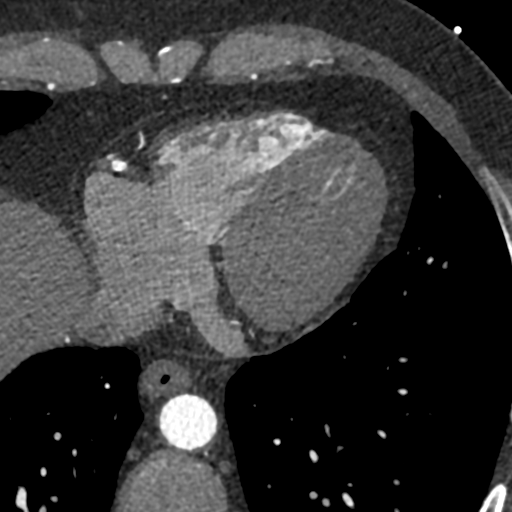
[im 87/261  lung]
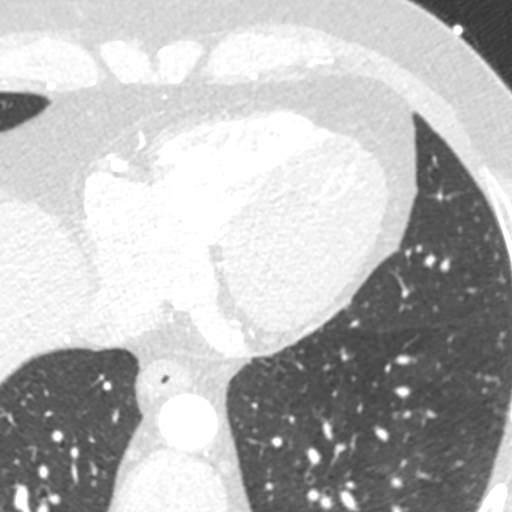
[im 174/261  vessel]
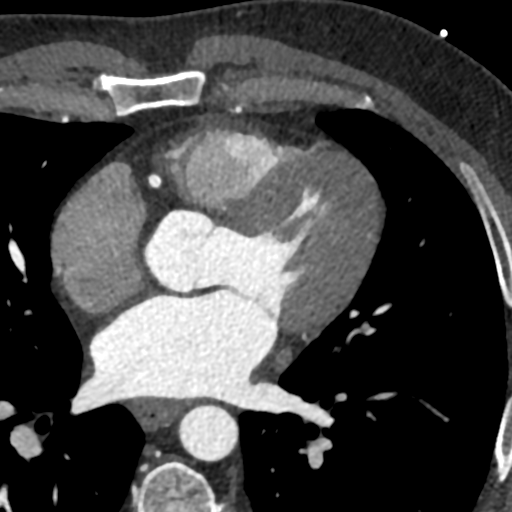

[Series 7: best diast · axial · 0.39mm/px · z∈[-283,-248]mm · 2 of 261 slices shown]
[im 87/261  vessel]
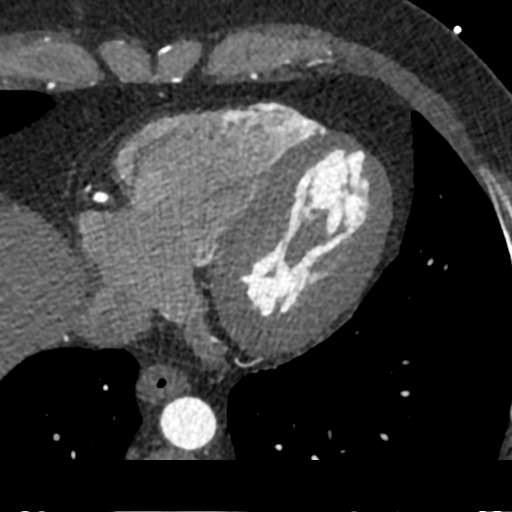
[im 174/261  vessel]
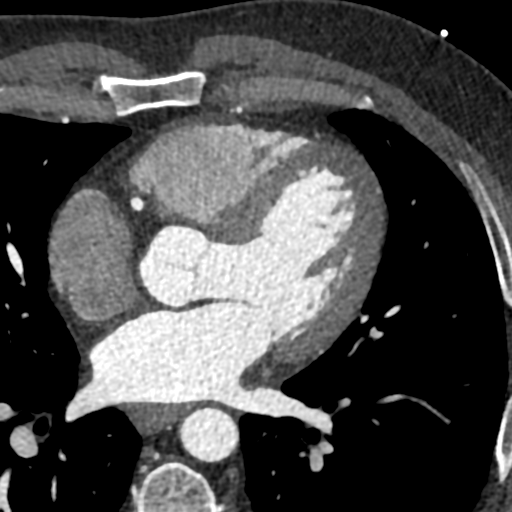

[Series 8: ts diast · axial · 0.39mm/px · z∈[-283,-248]mm · 2 of 261 slices shown]
[im 87/261  vessel]
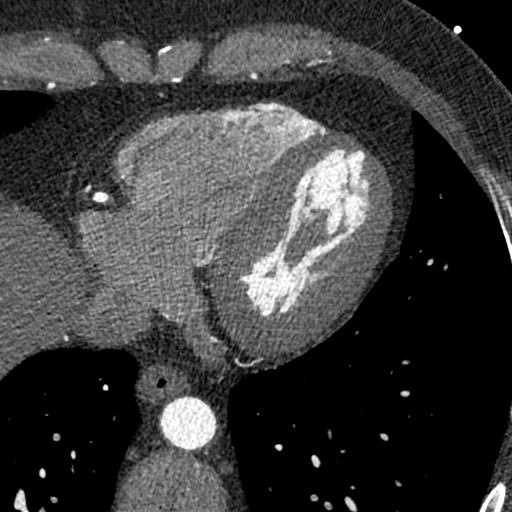
[im 174/261  vessel]
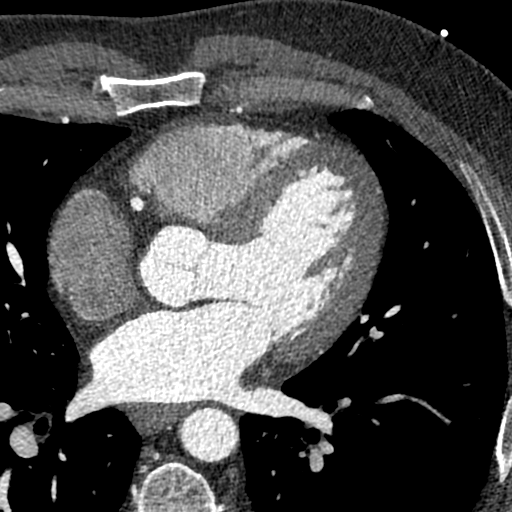

[Series 9: ts syst · axial · 0.39mm/px · z∈[-283,-248]mm · 2 of 261 slices shown]
[im 87/261  vessel]
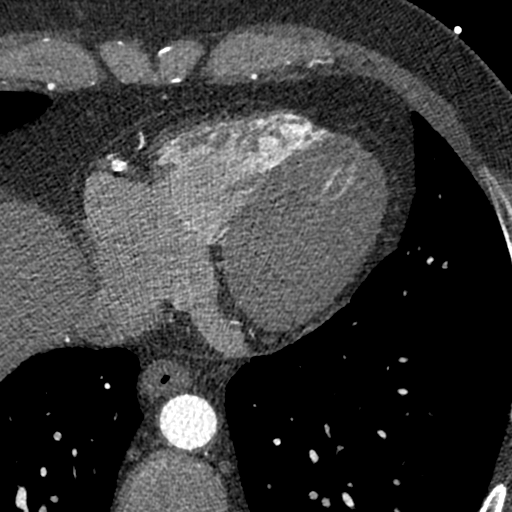
[im 174/261  vessel]
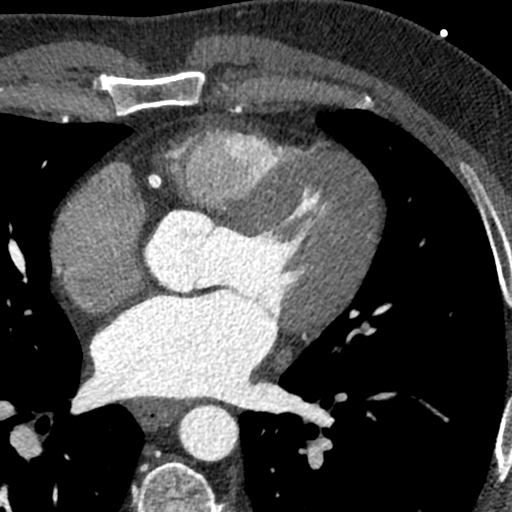

[8 of 20 positions shown; findings below may reference images not displayed]

FINDINGS: Image quality: Average.

Noise artifact is: Limited.

Coronary Arteries:  Normal coronary origin.  Right dominance.

Left main: The left main is a large caliber vessel with a normal
take off from the left coronary cusp that bifurcates to form a left
anterior descending artery and a left circumflex artery. There is no
plaque or stenosis.

Left anterior descending artery: The LAD has mild (25-49) mixed
plaque stenosis in the proximal vessel; the vessel is small
distally. The LAD gives off large D1 and large branching D2; no
plaque noted in the diagonals.

Left circumflex artery: The LCX is non-dominant and patent with no
evidence of plaque or stenosis; the vessel is small distallly. The
LCX gives off very small OM1 and large OM2; no plaque noted.

Right coronary artery: The RCA is dominant with normal take off from
the right coronary cusp. There is no evidence of plaque or stenosis.
The RCA terminates as a PDA and right posterolateral branch without
evidence of plaque or stenosis.

Right Atrium: Right atrial size is within normal limits.

Right Ventricle: The right ventricular cavity is within normal
limits.

Left Atrium: Left atrial size is normal in size with no left atrial
appendage filling defect.

Left Ventricle: The ventricular cavity size is within normal limits.
There are no stigmata of prior infarction. There is no abnormal
filling defect.

Pulmonary arteries: Normal in size without proximal filling defect.

Pulmonary veins: Normal pulmonary venous drainage.

Pericardium: Normal thickness with no significant effusion or
calcium present.

Cardiac valves: The aortic valve is trileaflet without significant
calcification. The mitral valve is normal structure without
significant calcification.

Aorta: Normal caliber with no significant disease.

Extra-cardiac findings: See attached radiology report for
non-cardiac structures.
IMPRESSION: 1. Coronary calcium score of 15.2. This was 63 percentile for age-,
sex, and race-matched controls.

2. Normal coronary origin with right dominance.

3. Mild CAD with 25-49 mixed plaque stenosis in the proximal LAD;
note significant component of soft plaque (higher risk).

RECOMMENDATIONS:
CAD-RADS 2: Mild non-obstructive CAD (25-49%). Consider
non-atherosclerotic causes of chest pain. Consider preventive
therapy and risk factor modification.

EXAM:
OVER-READ INTERPRETATION  CT CHEST

The following report is an over-read performed by radiologist Dr.
over-read does not include interpretation of cardiac or coronary
anatomy or pathology. The coronary calcium score and coronary
angiography interpretation by the cardiologist is attached.
FINDINGS: Cardiovascular: Cyst please see dedicated report for cardiovascular
details.

Mediastinum/Nodes: Imaging is confined to the mid chest and cardiac
structures only. Within the scan range there is no mediastinal
abnormality.

Lungs/Pleura: Lungs and airways to the extent evaluated without
acute process or suspicious finding. No consolidation or effusion.

Upper Abdomen: Visualized upper abdominal contents to the extent
evaluated are unremarkable.

Musculoskeletal: No acute bone finding. No destructive bone process.
Spinal degenerative changes.
IMPRESSION: No acute or significant extracardiac findings.

*** End of Addendum ***
FINDINGS: Image quality: Average.

Noise artifact is: Limited.

Coronary Arteries:  Normal coronary origin.  Right dominance.

Left main: The left main is a large caliber vessel with a normal
take off from the left coronary cusp that bifurcates to form a left
anterior descending artery and a left circumflex artery. There is no
plaque or stenosis.

Left anterior descending artery: The LAD has mild (25-49) mixed
plaque stenosis in the proximal vessel; the vessel is small
distally. The LAD gives off large D1 and large branching D2; no
plaque noted in the diagonals.

Left circumflex artery: The LCX is non-dominant and patent with no
evidence of plaque or stenosis; the vessel is small distallly. The
LCX gives off very small OM1 and large OM2; no plaque noted.

Right coronary artery: The RCA is dominant with normal take off from
the right coronary cusp. There is no evidence of plaque or stenosis.
The RCA terminates as a PDA and right posterolateral branch without
evidence of plaque or stenosis.

Right Atrium: Right atrial size is within normal limits.

Right Ventricle: The right ventricular cavity is within normal
limits.

Left Atrium: Left atrial size is normal in size with no left atrial
appendage filling defect.

Left Ventricle: The ventricular cavity size is within normal limits.
There are no stigmata of prior infarction. There is no abnormal
filling defect.

Pulmonary arteries: Normal in size without proximal filling defect.

Pulmonary veins: Normal pulmonary venous drainage.

Pericardium: Normal thickness with no significant effusion or
calcium present.

Cardiac valves: The aortic valve is trileaflet without significant
calcification. The mitral valve is normal structure without
significant calcification.

Aorta: Normal caliber with no significant disease.

Extra-cardiac findings: See attached radiology report for
non-cardiac structures.
IMPRESSION: 1. Coronary calcium score of 15.2. This was 63 percentile for age-,
sex, and race-matched controls.

2. Normal coronary origin with right dominance.

3. Mild CAD with 25-49 mixed plaque stenosis in the proximal LAD;
note significant component of soft plaque (higher risk).

RECOMMENDATIONS:
CAD-RADS 2: Mild non-obstructive CAD (25-49%). Consider
non-atherosclerotic causes of chest pain. Consider preventive
therapy and risk factor modification.

## 2023-06-18 IMAGING — CT CT ABD-PELV W/O CM
2 of 4 series · 17 of 46 positions shown, 19 images · non-contrast
Comparison: None.

CLINICAL DATA: Left-sided lower back pain.



[Series 2: abd pel wo · axial · 0.81mm/px · z∈[-193,+207]mm · 14 of 88 slices shown, 16 images]
[im 4/88  soft-tissue]
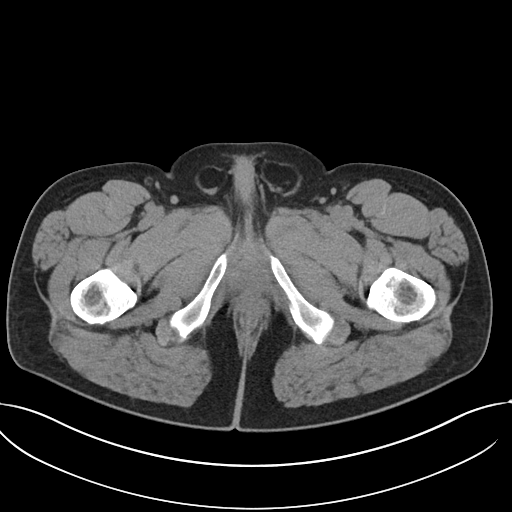
[im 4/88  bone]
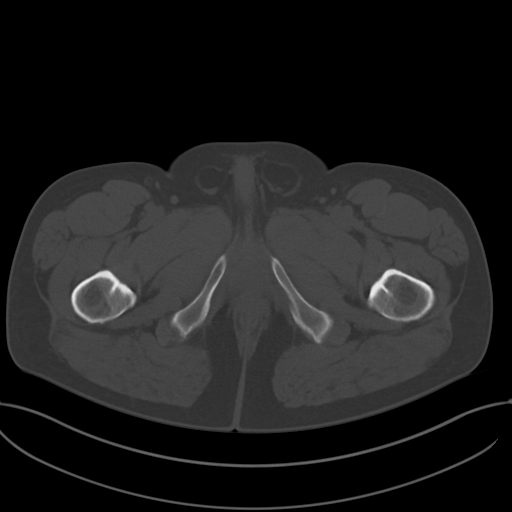
[im 11/88  soft-tissue]
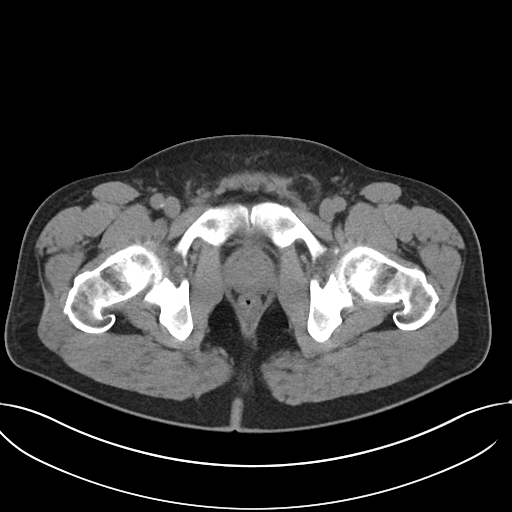
[im 17/88  soft-tissue]
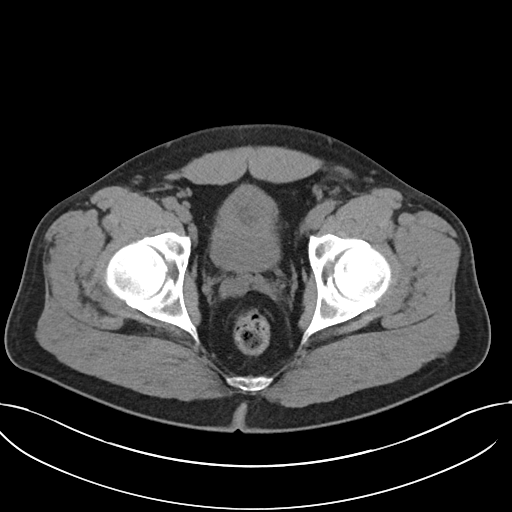
[im 24/88  soft-tissue]
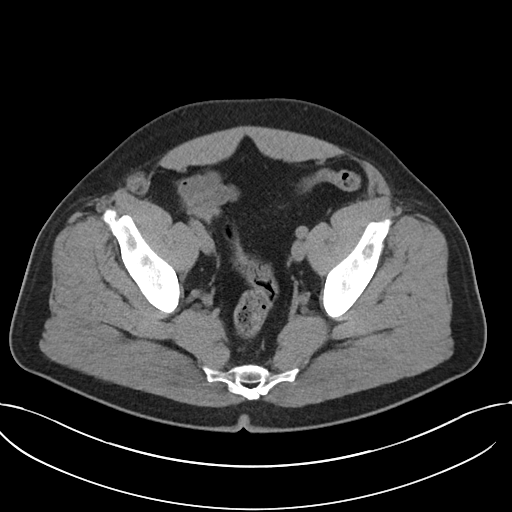
[im 31/88  soft-tissue]
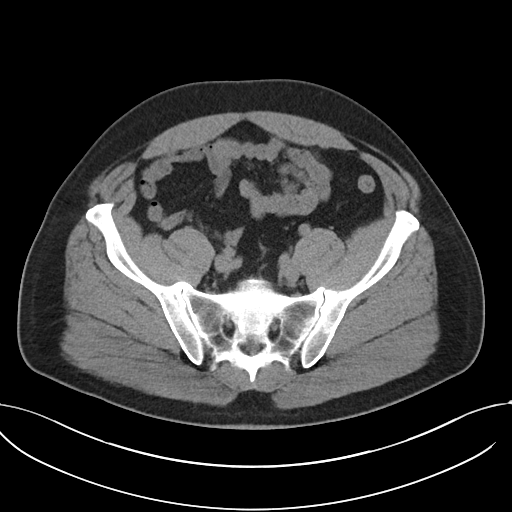
[im 34/88  soft-tissue]
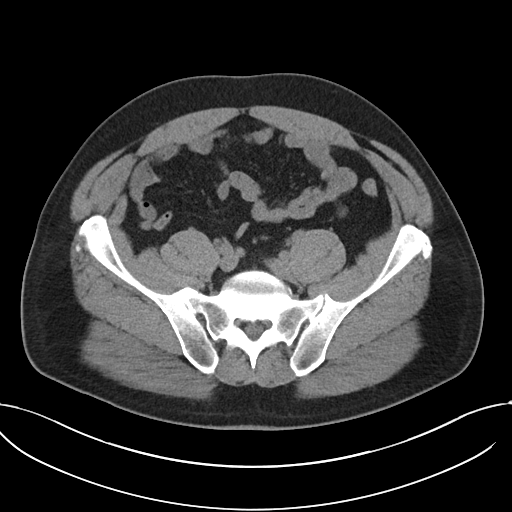
[im 41/88  soft-tissue]
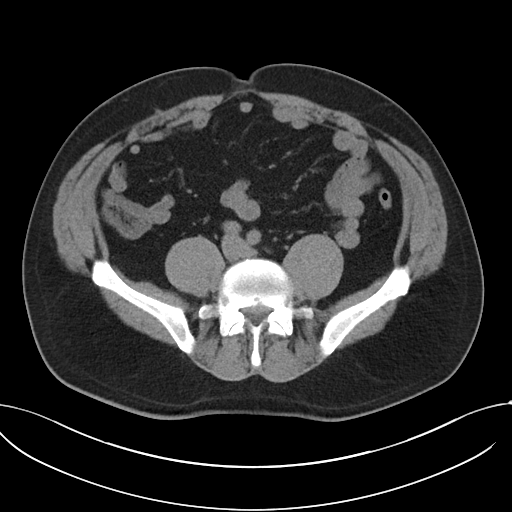
[im 47/88  soft-tissue]
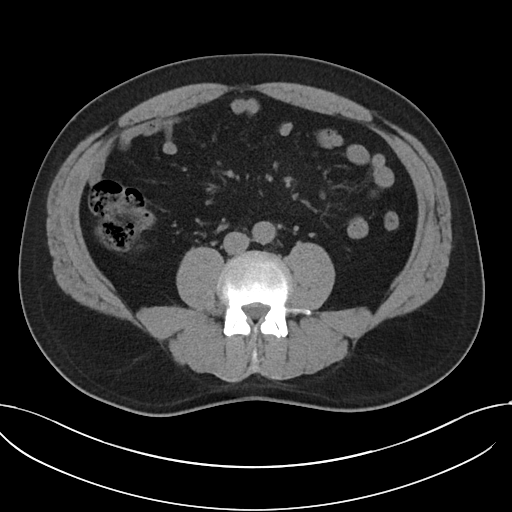
[im 54/88  soft-tissue]
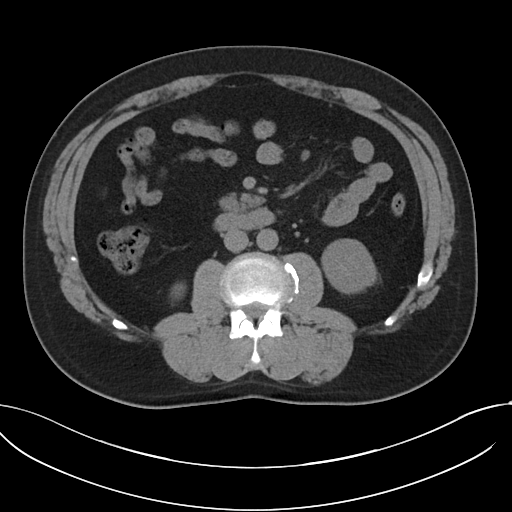
[im 54/88  bone]
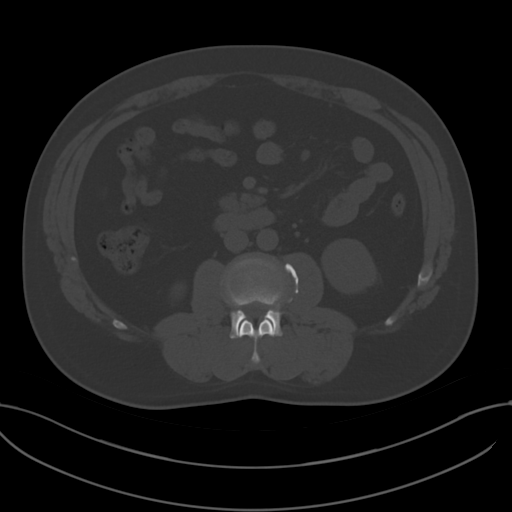
[im 57/88  soft-tissue]
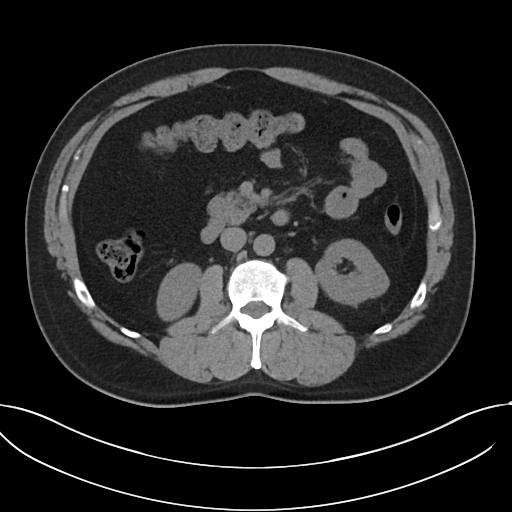
[im 64/88  soft-tissue]
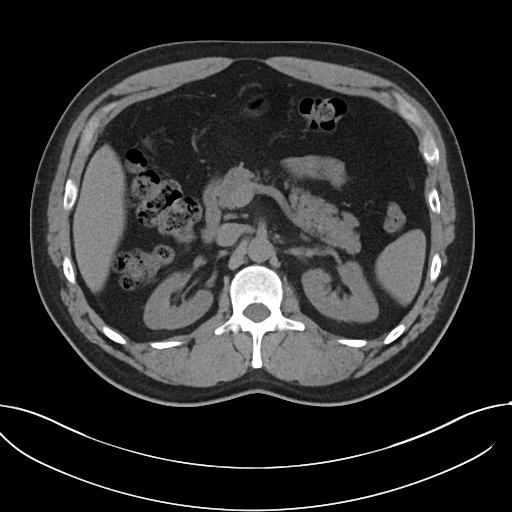
[im 71/88  soft-tissue]
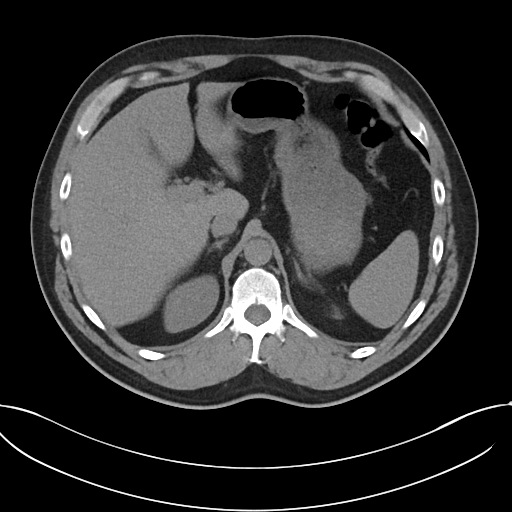
[im 77/88  soft-tissue]
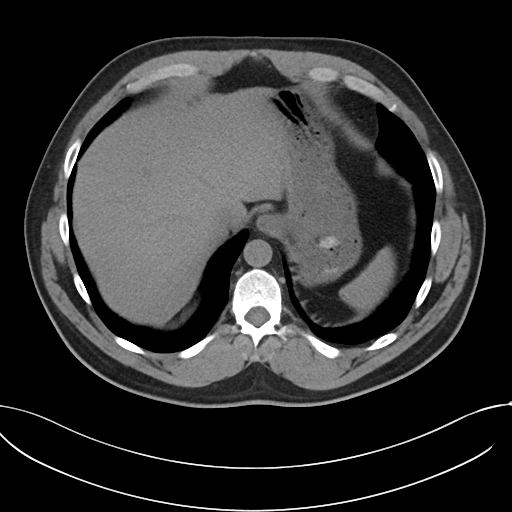
[im 84/88  soft-tissue]
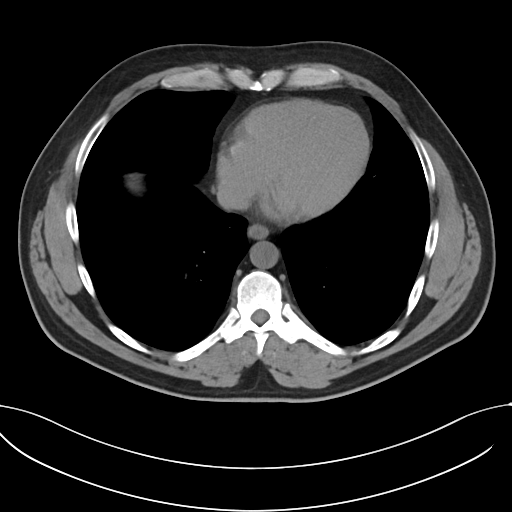

[Series 5: coronal · coronal · 0.79mm/px · 3 of 109 slices shown]
[im 37/109  soft-tissue]
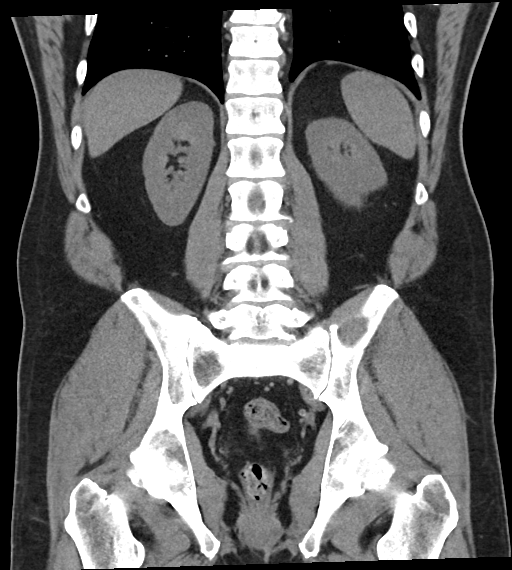
[im 49/109  soft-tissue]
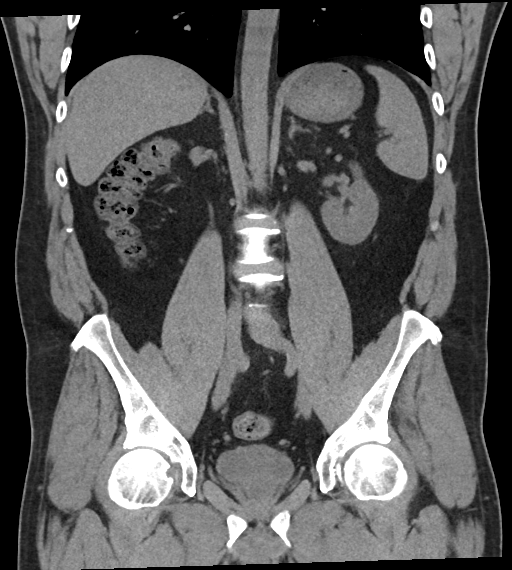
[im 61/109  soft-tissue]
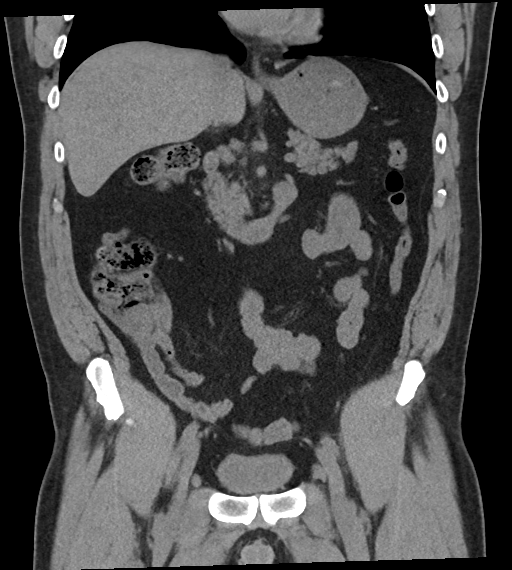

[17 of 46 positions shown; findings below may reference images not displayed]

FINDINGS: Lower chest: No acute abnormality.

Hepatobiliary: No focal liver abnormality is seen. No gallstones,
gallbladder wall thickening, or biliary dilatation.

Pancreas: Unremarkable. No pancreatic ductal dilatation or
surrounding inflammatory changes.

Spleen: Normal in size without focal abnormality.

Adrenals/Urinary Tract: Adrenal glands are unremarkable. Kidneys are
normal, without renal calculi, focal lesion, or hydronephrosis.
Bladder is unremarkable.

Stomach/Bowel: Stomach is within normal limits. Appendix appears
normal. No evidence of bowel wall thickening, distention, or
inflammatory changes.

Vascular/Lymphatic: No significant vascular findings are present. No
enlarged abdominal or pelvic lymph nodes.

Reproductive: Prostate is unremarkable.

Other: No abdominal wall hernia or abnormality. No abdominopelvic
ascites.

Musculoskeletal: No acute or significant osseous findings.
IMPRESSION: No definite abnormality seen in the abdomen or pelvis.

## 2024-08-05 ENCOUNTER — Ambulatory Visit

## 2024-08-05 ENCOUNTER — Ambulatory Visit: Admitting: Podiatry

## 2024-08-05 VITALS — Ht 69.0 in

## 2024-08-05 DIAGNOSIS — M79671 Pain in right foot: Secondary | ICD-10-CM

## 2024-08-05 DIAGNOSIS — M9261 Juvenile osteochondrosis of tarsus, right ankle: Secondary | ICD-10-CM

## 2024-08-05 DIAGNOSIS — M9262 Juvenile osteochondrosis of tarsus, left ankle: Secondary | ICD-10-CM | POA: Diagnosis not present

## 2024-08-05 DIAGNOSIS — S86012A Strain of left Achilles tendon, initial encounter: Secondary | ICD-10-CM | POA: Diagnosis not present

## 2024-08-05 DIAGNOSIS — M62462 Contracture of muscle, left lower leg: Secondary | ICD-10-CM

## 2024-08-05 DIAGNOSIS — M62461 Contracture of muscle, right lower leg: Secondary | ICD-10-CM | POA: Diagnosis not present

## 2024-08-05 DIAGNOSIS — S86011A Strain of right Achilles tendon, initial encounter: Secondary | ICD-10-CM | POA: Diagnosis not present

## 2024-08-05 NOTE — Patient Instructions (Signed)

## 2024-08-06 NOTE — Progress Notes (Signed)
 "  Subjective:  Patient ID: Jonathon Marshall, male    DOB: 02/19/1966,  MRN: [id number]  Chief Complaint  Patient presents with   Heel Spurs    RM 20 Patient is here for bilateral heel pain. Pt states pain has been present for years, more intense in the right foot. Pt has tried insoles, new shoes,  cold therapy with no relief.    Discussed the use of AI scribe software for clinical note transcription with the patient, who gave verbal consent to proceed.  History of Present Illness Jonathon Marshall is a 59 year old male with chronic bilateral insertional Achilles tendinitis, calcific tendinitis, and heel spurs who presents for evaluation of worsening right heel pain.  Heel pain has been present bilaterally for approximately eight to ten years, with the right heel currently significantly more symptomatic than the left. Pain is localized to the posterior aspect of the right heel, described as severe, particularly over a palpable spur. The left heel was previously more symptomatic but is now less so than the right.  He has previously undergone physical therapy, immobilization with a boot, injections, and pharmacologic therapy without lasting relief. He expresses frustration with repeated conservative management and is seeking a more definitive intervention.  There is no history of acute injury, falls, or tendon rupture associated with the current heel pain. Prior metatarsal injuries are unrelated to his current symptoms.  He denies pain in the midfoot, plantar arch, plantar fascia, upper heel, or calf.  He has not had an MRI of the affected area. Radiographs have demonstrated spurring and calcification at the posterior heel.  He is concerned about the risk of tendon rupture and the impact of his symptoms on his ability to work, as his occupation requires prolonged standing.      Objective:    Physical Exam VASCULAR: DP and PT pulse palpable. Foot is warm and well-perfused. Capillary fill  time is brisk. Good pulses and circulation. DERMATOLOGIC: Normal skin turgor texture and temperature. No open lesions or rashes or ulcerations. NEUROLOGIC: Normal sensation to light touch and pressure. No paresthesias on examination. ORTHOPEDIC: Sharp pain on palpation bilaterally on posterior heel with large palpable spur, inflammation, and bursal formation posteriorly. Equinus contracture bilaterally. No pain in the midsubstance of the foot, plantar arch, or plantar fascia.   No images are attached to the encounter.    Results Radiology Bilateral foot radiograph: Posterior calcaneal enthesophytes, spurring with calcification of the tendon, and Haglund deformity (Independently interpreted)   Assessment:   1. Partial Achilles tendon tear, left, initial encounter   2. Partial tear of Achilles tendon, right, initial encounter   3. Haglund's deformity, left   4. Haglund's deformity, right   5. Gastrocnemius equinus of left lower extremity   6. Gastrocnemius equinus of right lower extremity      Plan:  Patient was evaluated and treated and all questions answered.  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 Conservative management including physical therapy, immobilization, injections, and medications has failed. There is concern for progressive tendon degeneration and risk of rupture. Surgical intervention is indicated due to refractory symptoms and severity. Open versus endoscopic approaches were discussed, with the choice dependent on MRI findings regarding tendon tearing and spur size. Endoscopic approach may reduce scar size, pain,  and swelling, but both require Achilles tendon detachment and  repair, with approximately three months recovery. Anticipated outcomes include low recurrence rate; risks include prolonged nonweight bearing, inability to drive for up to eight weeks, and need for support during recovery. - Ordered bilateral foot MRI to assess tendon quality, degree of tearing, and spur size. - Provided anticipatory guidance regarding surgical intervention, including open versus endoscopic approaches based on MRI findings. - Discussed referral process to [facility] for MRI scheduling and advised him to contact the imaging center if not scheduled within one week. - Reviewed insurance authorization requirements and possible need for additional documentation of prior treatments. - Will review MRI results and discuss surgical planning at follow-up.  Bilateral equinus contracture Bilateral equinus contracture identified on examination, contributing to abnormal biomechanics and likely exacerbating insertional Achilles pathology. Chronic and associated with longstanding heel pain.  Likely needs gastroc recession with reconstruction      No follow-ups on file.   "

## 2024-08-17 ENCOUNTER — Encounter: Payer: Self-pay | Admitting: Podiatry

## 2024-08-24 ENCOUNTER — Ambulatory Visit
Admission: RE | Admit: 2024-08-24 | Discharge: 2024-08-24 | Disposition: A | Source: Ambulatory Visit | Attending: Podiatry

## 2024-08-24 DIAGNOSIS — S86012A Strain of left Achilles tendon, initial encounter: Secondary | ICD-10-CM

## 2024-08-24 DIAGNOSIS — S86011A Strain of right Achilles tendon, initial encounter: Secondary | ICD-10-CM

## 2024-08-27 ENCOUNTER — Ambulatory Visit: Payer: Self-pay | Admitting: Podiatry
